# Patient Record
Sex: Male | Born: 1980
Health system: Southern US, Community
[De-identification: ages and names within clinical notes are randomized; demographics above are authoritative.]

## PROBLEM LIST (undated history)

## (undated) DIAGNOSIS — I10 Essential (primary) hypertension: Secondary | ICD-10-CM

---

## 2005-11-02 ENCOUNTER — Emergency Department: Payer: Self-pay | Admitting: Internal Medicine

## 2006-03-14 ENCOUNTER — Other Ambulatory Visit: Payer: Self-pay

## 2006-03-14 ENCOUNTER — Emergency Department: Payer: Self-pay | Admitting: Emergency Medicine

## 2007-03-16 ENCOUNTER — Emergency Department: Payer: Self-pay | Admitting: Emergency Medicine

## 2007-04-07 ENCOUNTER — Emergency Department: Payer: Self-pay | Admitting: Emergency Medicine

## 2008-04-26 ENCOUNTER — Emergency Department: Payer: Self-pay | Admitting: Emergency Medicine

## 2008-10-18 ENCOUNTER — Emergency Department: Payer: Self-pay | Admitting: Emergency Medicine

## 2009-08-09 IMAGING — CT CT HEAD WITHOUT CONTRAST
2 series · 16 of 30 positions shown, 20 images · non-contrast
Comparison: none

REASON FOR EXAM: pain
COMMENTS:

[Series 2: without · axial · non-contrast · 0.43mm/px · z∈[-173,-48]mm · 13 of 39 slices shown, 17 images]
[im 3/39  brain]
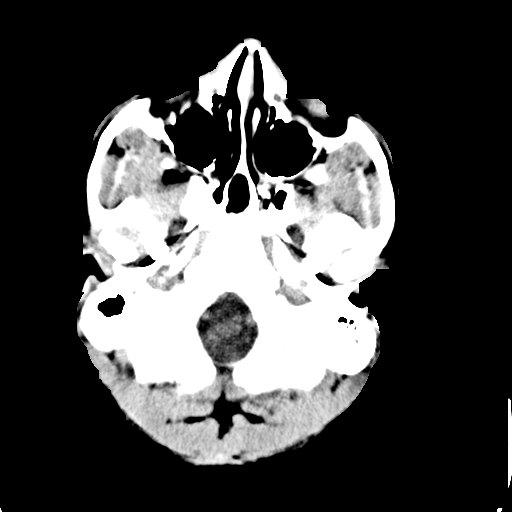
[im 3/39  bone]
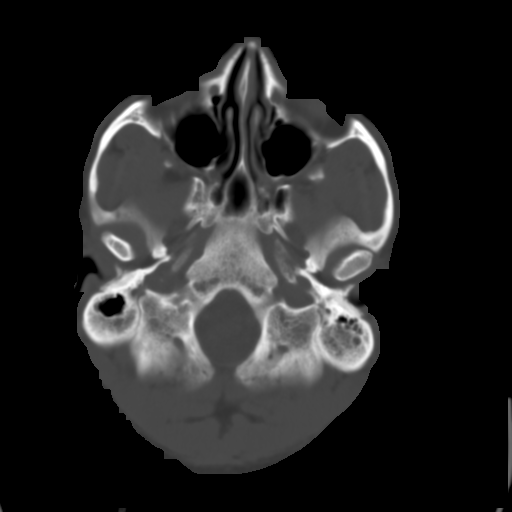
[im 6/39  brain]
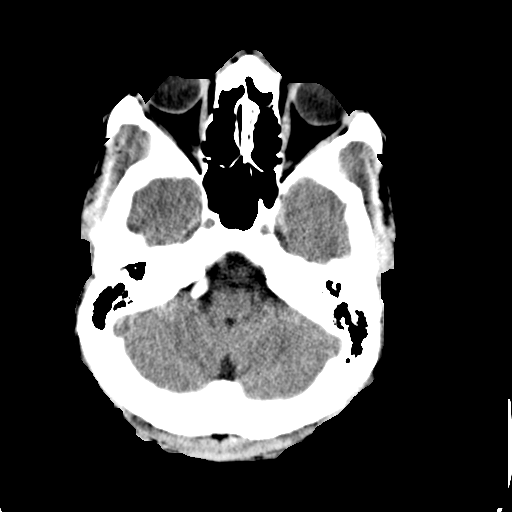
[im 9/39  brain]
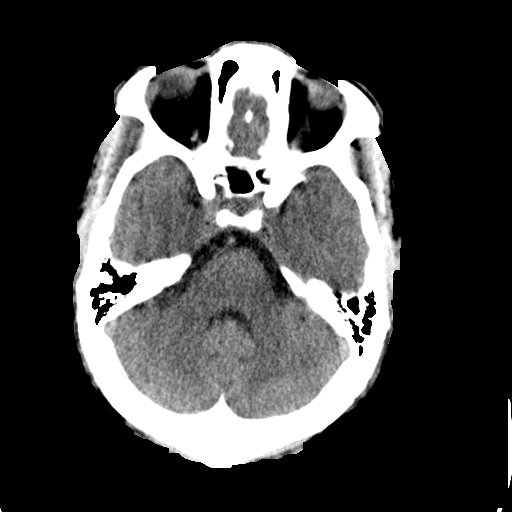
[im 11/39  brain]
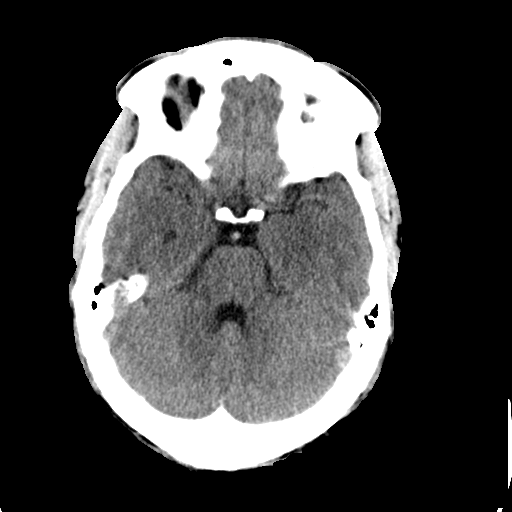
[im 14/39  brain]
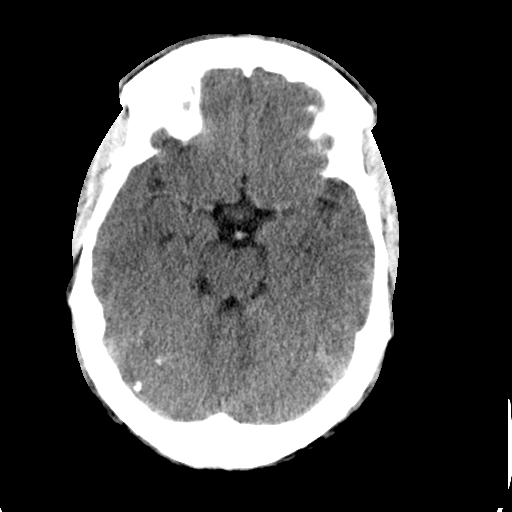
[im 14/39  bone]
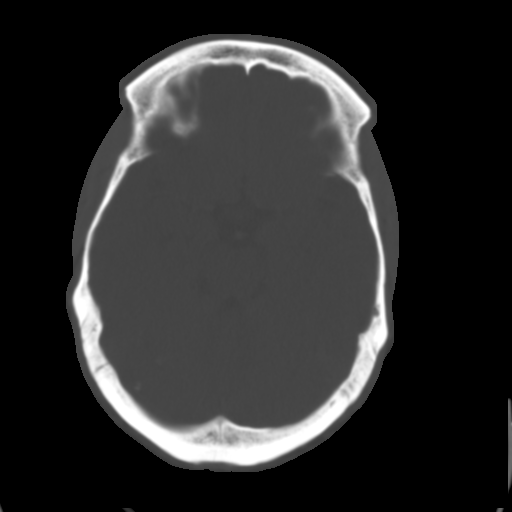
[im 17/39  brain]
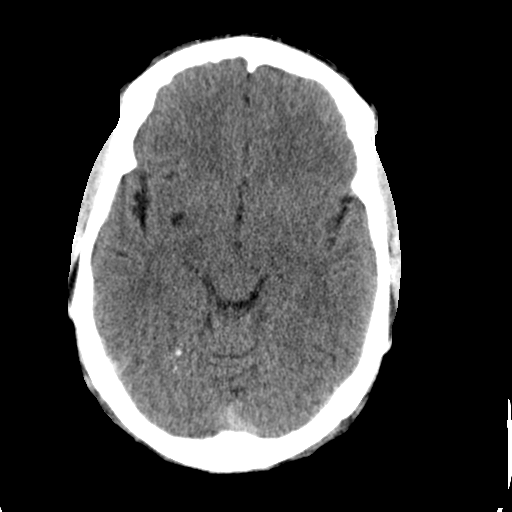
[im 20/39  brain]
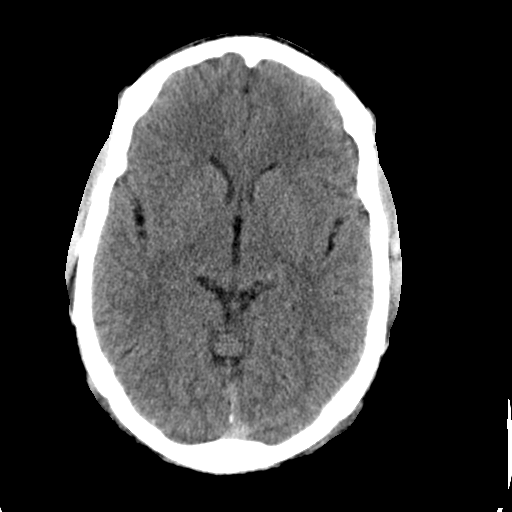
[im 22/39  brain]
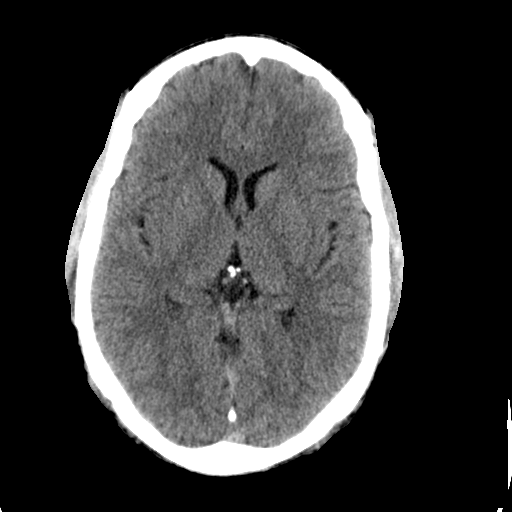
[im 25/39  brain]
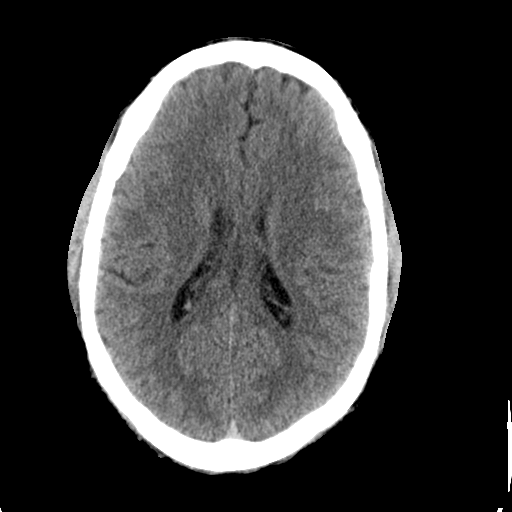
[im 25/39  bone]
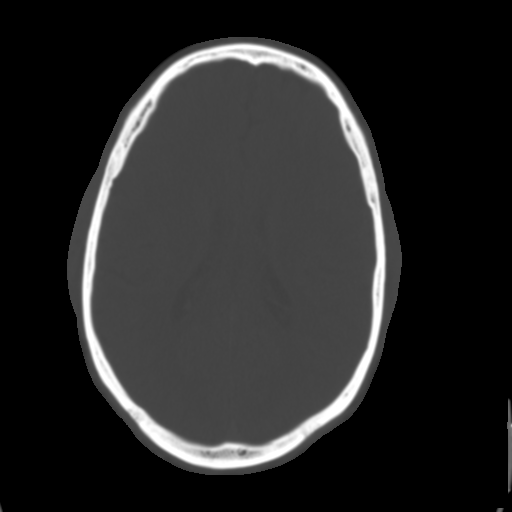
[im 28/39  brain]
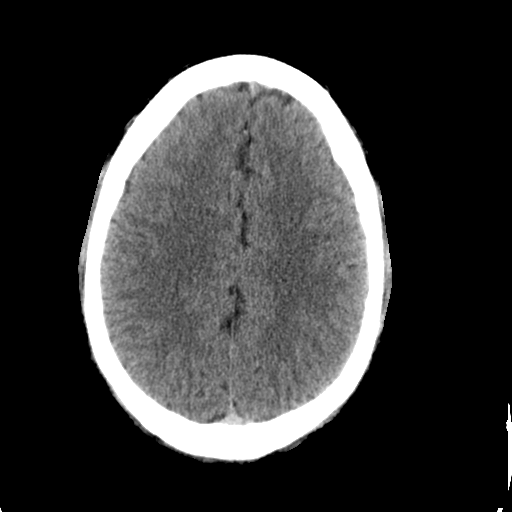
[im 30/39  brain]
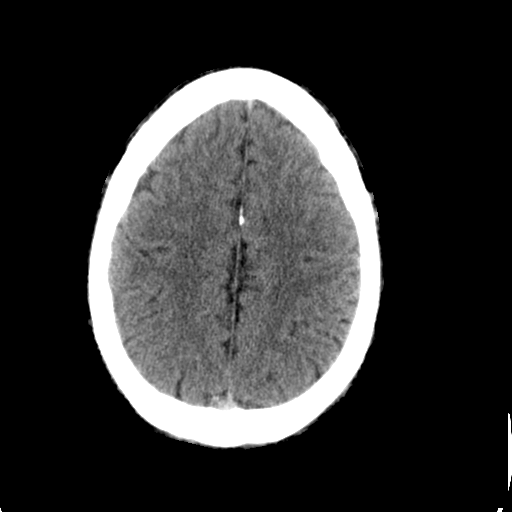
[im 33/39  brain]
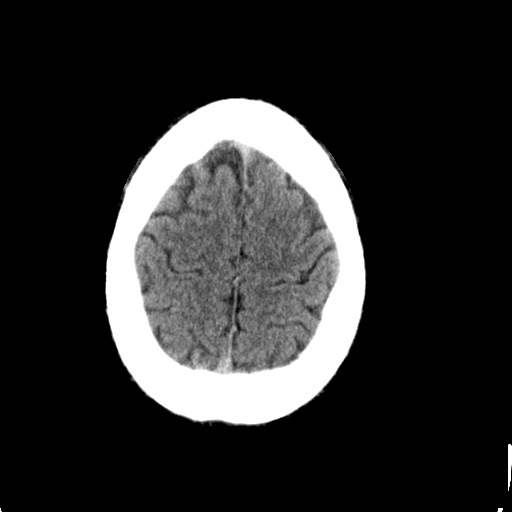
[im 36/39  brain]
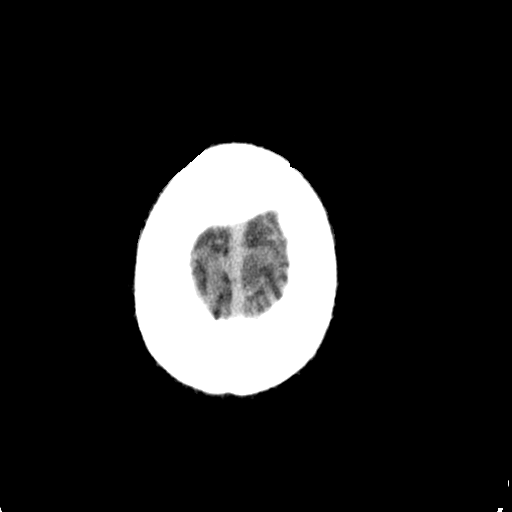
[im 36/39  bone]
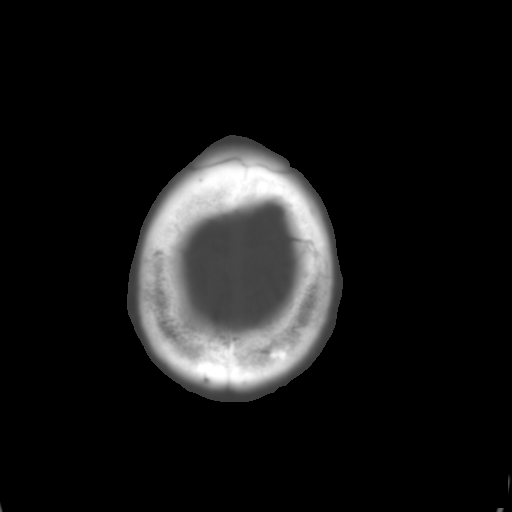

[Series 3: bone · axial · 0.43mm/px · z∈[-173,-138]mm · 3 of 39 slices shown]
[im 3/39  bone]
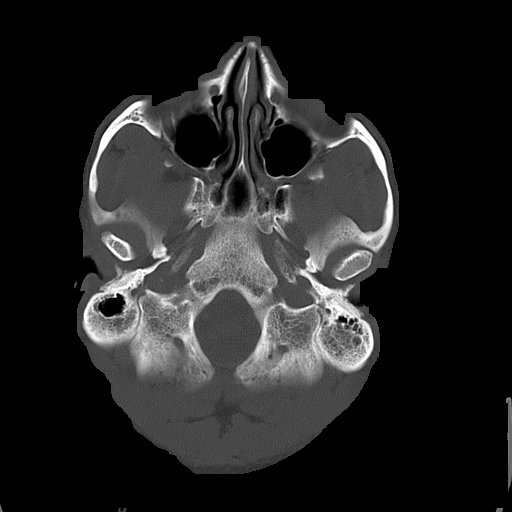
[im 9/39  bone]
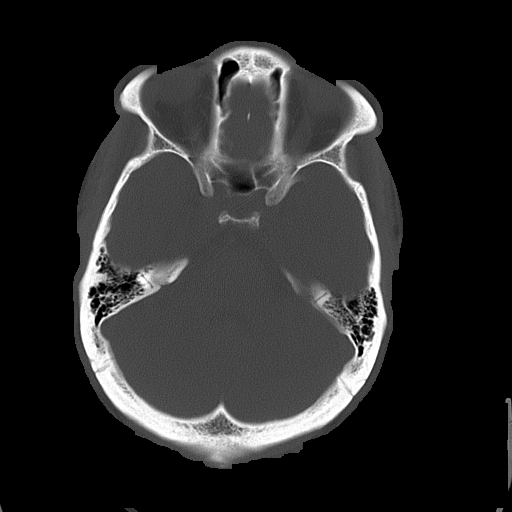
[im 14/39  bone]
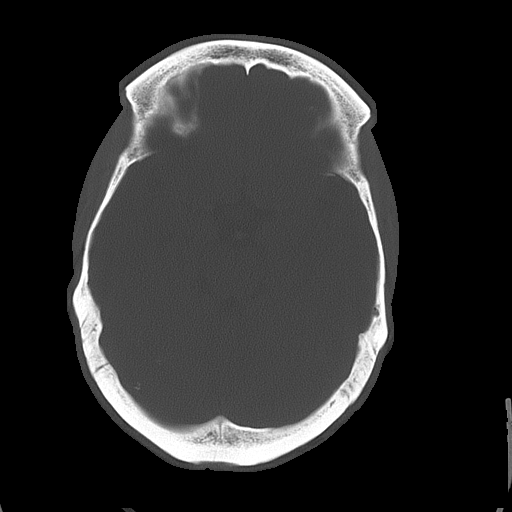

[16 of 30 positions shown; findings below may reference images not displayed]

PROCEDURE:     CT  - CT HEAD WITHOUT CONTRAST  - October 18, 2008  [DATE]

RESULT:     Axial noncontrast CT scanning was performed through the brain at
5 mm intervals and slice thicknesses.

The ventricles are normal in size and position. There is no evidence of an
intracranial hemorrhage nor of intracranial mass effect. The cerebellum and
brainstem are normal in density. At bone window settings the observed
portions of the paranasal sinuses are clear. I do not see evidence of an
acute skull fracture.
IMPRESSION: I see no acute intracranial abnormality.

A preliminary report was sent to the [HOSPITAL] the conclusion
of the study.

## 2009-11-25 ENCOUNTER — Emergency Department: Payer: Self-pay | Admitting: Emergency Medicine

## 2012-01-14 ENCOUNTER — Emergency Department: Payer: Self-pay | Admitting: *Deleted

## 2013-02-09 ENCOUNTER — Emergency Department: Payer: Self-pay | Admitting: Emergency Medicine

## 2013-02-09 LAB — COMPREHENSIVE METABOLIC PANEL
Albumin: 3.9 g/dL (ref 3.4–5.0)
Alkaline Phosphatase: 83 U/L (ref 50–136)
Anion Gap: 3 — ABNORMAL LOW (ref 7–16)
Bilirubin,Total: 1 mg/dL (ref 0.2–1.0)
Calcium, Total: 9.4 mg/dL (ref 8.5–10.1)
Co2: 31 mmol/L (ref 21–32)
Creatinine: 1.2 mg/dL (ref 0.60–1.30)
EGFR (Non-African Amer.): >60
Glucose: 95 mg/dL (ref 65–99)
Osmolality: 281 (ref 275–301)
Potassium: 4.1 mmol/L (ref 3.5–5.1)
SGPT (ALT): 25 U/L (ref 12–78)

## 2013-02-09 LAB — CBC
HGB: 17.3 g/dL (ref 13.0–18.0)
MCH: 31.1 pg (ref 26.0–34.0)
MCHC: 34.3 g/dL (ref 32.0–36.0)
RBC: 5.56 10*6/uL (ref 4.40–5.90)
RDW: 14.5 % (ref 11.5–14.5)

## 2013-02-09 LAB — DRUG SCREEN, URINE
Barbiturates, Ur Screen: NEGATIVE (ref ?–200)
Cannabinoid 50 Ng, Ur ~~LOC~~: NEGATIVE (ref ?–50)
Cocaine Metabolite,Ur ~~LOC~~: NEGATIVE (ref ?–300)
Methadone, Ur Screen: NEGATIVE (ref ?–300)
Opiate, Ur Screen: NEGATIVE (ref ?–300)
Phencyclidine (PCP) Ur S: NEGATIVE (ref ?–25)

## 2013-02-09 LAB — TROPONIN I
Troponin-I: <0.02 ng/mL
Troponin-I: <0.02 ng/mL

## 2013-02-09 LAB — CK TOTAL AND CKMB (NOT AT ARMC): CK-MB: 4.8 ng/mL — ABNORMAL HIGH (ref 0.5–3.6)

## 2013-12-01 IMAGING — CR DG CHEST 2V
1 series · 2 of 2 positions shown · non-contrast
Comparison: none

REASON FOR EXAM: Chest Pain
COMMENTS:

PROCEDURE:     DXR - DXR CHEST PA (OR AP) AND LATERAL  - February 09, 2013  [DATE]
RESULT:     Comparison: None

[Series 1: pa · 0.17mm/px · 2 of 2 slices shown]
[im 1/2]
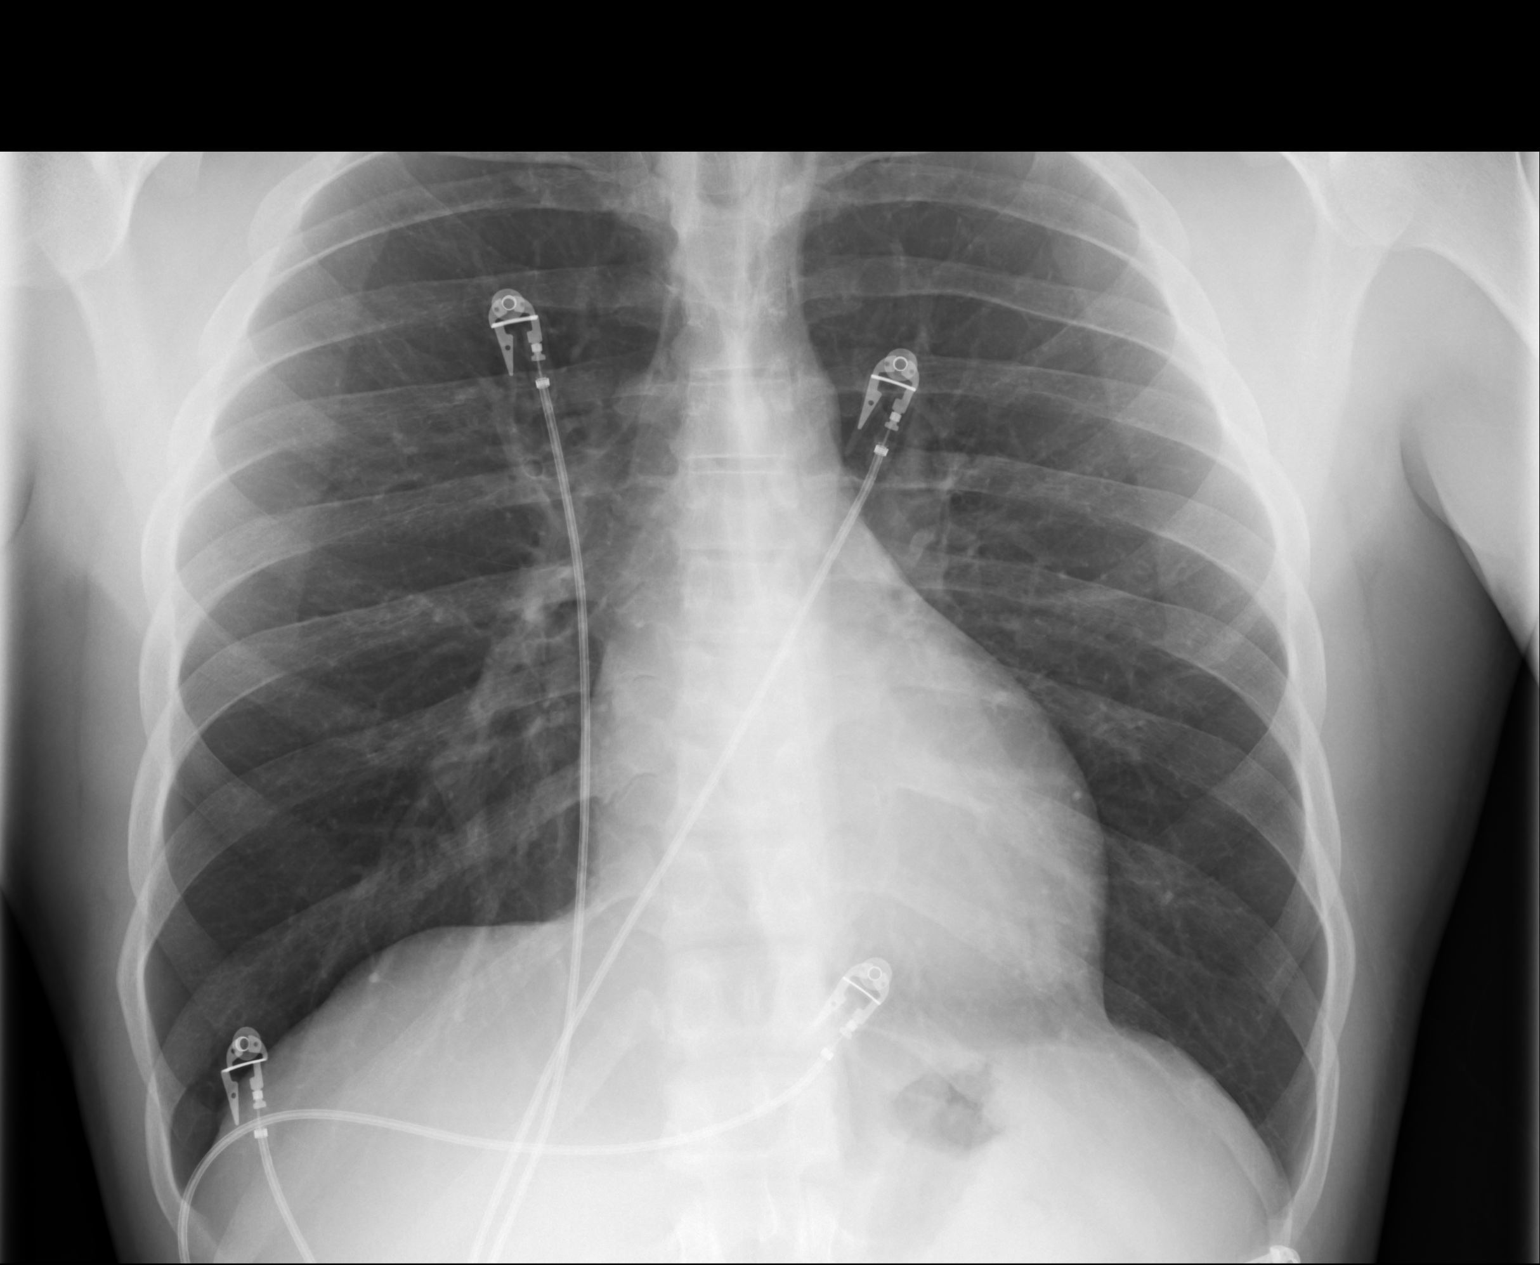
[im 2/2]
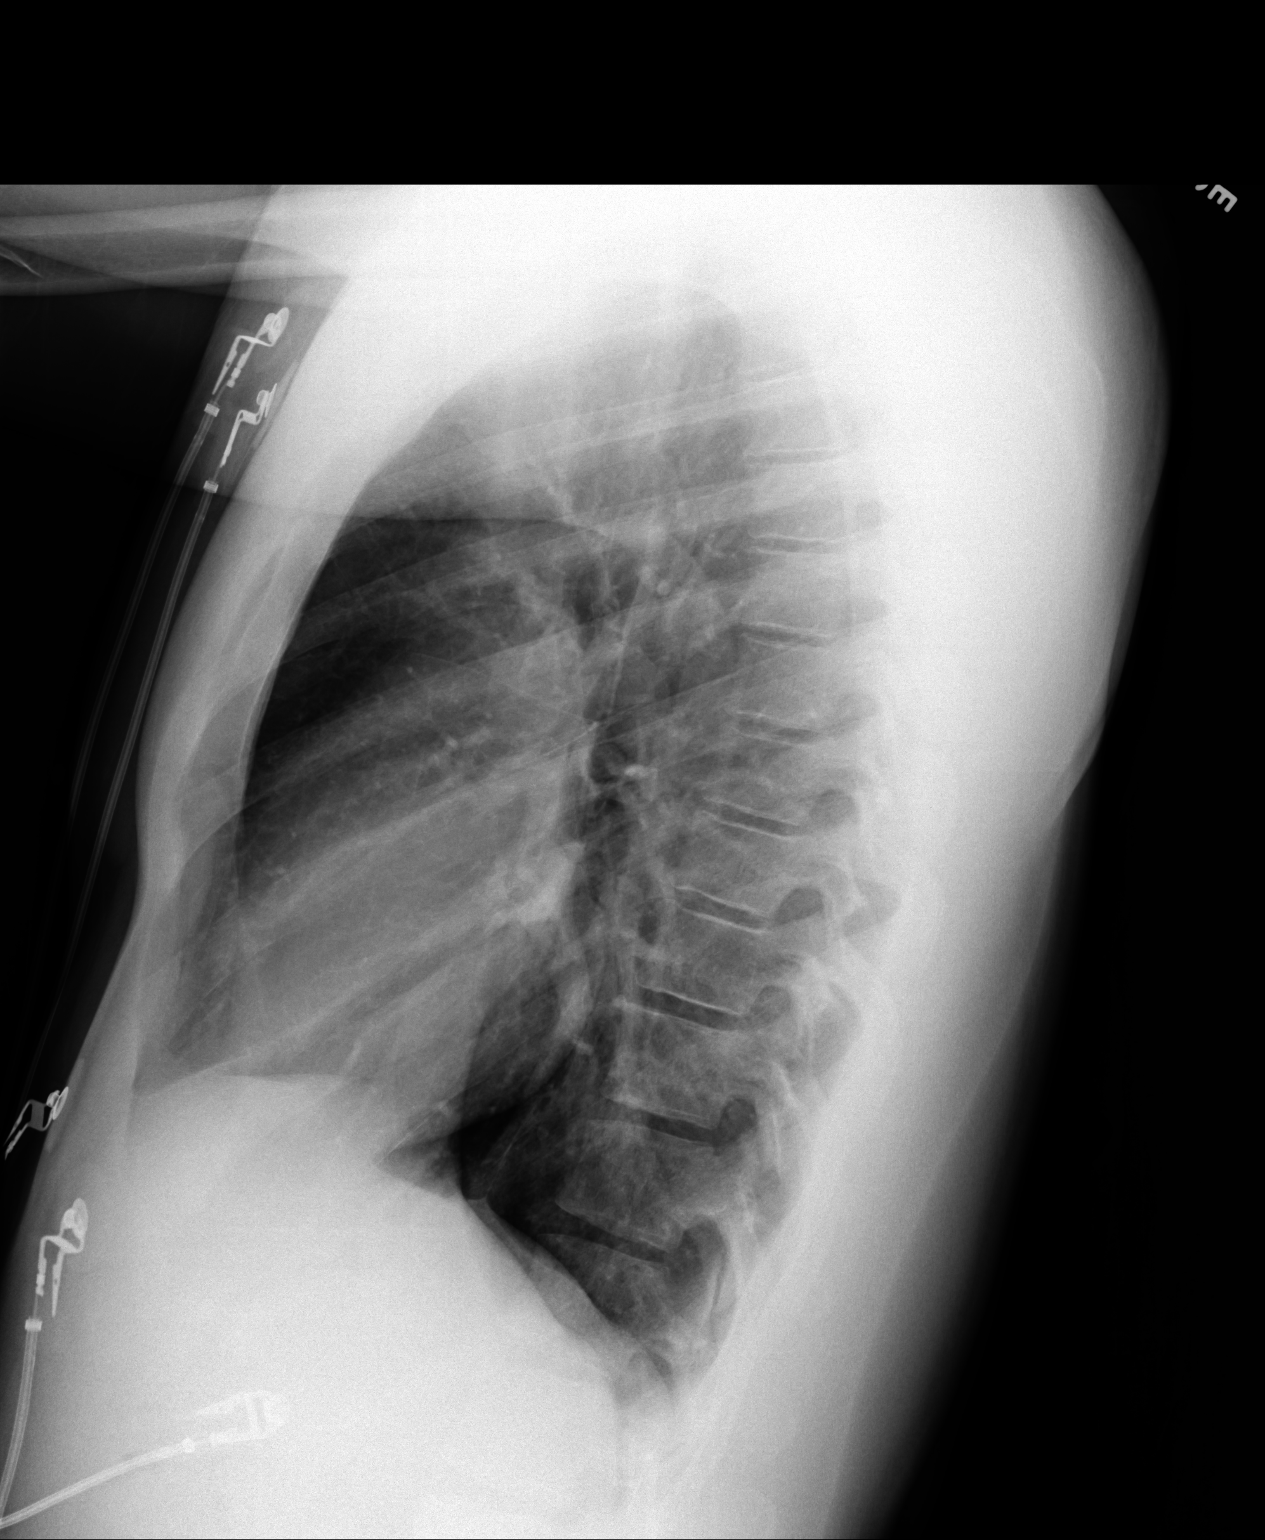

[2 of 2 positions shown; findings below may reference images not displayed]

FINDINGS: PA and lateral chest radiographs are provided.  There is no focal
parenchymal opacity, pleural effusion, or pneumothorax. The heart and
mediastinum are unremarkable.  The osseous structures are unremarkable.
IMPRESSION: No acute disease of the che[REDACTED]

## 2022-03-23 ENCOUNTER — Telehealth: Payer: Self-pay | Admitting: Family

## 2022-03-23 ENCOUNTER — Encounter: Payer: Self-pay | Admitting: Emergency Medicine

## 2022-03-23 ENCOUNTER — Ambulatory Visit
Admission: EM | Admit: 2022-03-23 | Discharge: 2022-03-23 | Disposition: A | Discharge status: Home / Self Care | Payer: Self-pay | Attending: Urgent Care | Admitting: Urgent Care

## 2022-03-23 DIAGNOSIS — R369 Urethral discharge, unspecified: Secondary | ICD-10-CM | POA: Insufficient documentation

## 2022-03-23 HISTORY — DX: Essential (primary) hypertension: I10

## 2022-03-23 MED ORDER — DOXYCYCLINE HYCLATE 100 MG PO CAPS: 100.0000 mg | ORAL_CAPSULE | Freq: Two times a day (BID) | ORAL | 0 refills | Status: AC

## 2022-03-23 MED ORDER — CEFTRIAXONE SODIUM 500 MG IJ SOLR
500.0000 mg | Freq: Once | INTRAMUSCULAR | Status: AC
  Administered 2022-03-23: 500 mg via INTRAMUSCULAR

## 2022-03-23 NOTE — ED Triage Notes (Signed)
Pt here with 5 days of penile discharge x 1 week.

## 2022-03-23 NOTE — Progress Notes (Signed)
Virtual Visit Consent   Gustavo Dispenza, you are scheduled for a virtual visit with a [facility] provider today. Just as with appointments in the office, your consent must be obtained to participate. Your consent will be active for this visit and any virtual visit you may have with one of our providers in the next 365 days. If you have a MyChart account, a copy of this consent can be sent to you electronically.  As this is a virtual visit, video technology does not allow for your provider to perform a traditional examination. This may limit your provider's ability to fully assess your condition. If your provider identifies any concerns that need to be evaluated in person or the need to arrange testing (such as labs, EKG, etc.), we will make arrangements to do so. Although advances in technology are sophisticated, we cannot ensure that it will always work on either your end or our end. If the connection with a video visit is poor, the visit may have to be switched to a telephone visit. With either a video or telephone visit, we are not always able to ensure that we have a secure connection.  By engaging in this virtual visit, you consent to the provision of healthcare and authorize for your insurance to be billed (if applicable) for the services provided during this visit. Depending on your insurance coverage, you may receive a charge related to this service.  I need to obtain your verbal consent now. Are you willing to proceed with your visit today? Troy Hoover has provided verbal consent on 03/23/2022 for a virtual visit (video or telephone). Jannifer Rodney, FNP  Date: 03/23/2022 10:20 AM  Virtual Visit via Video Note   I, Jannifer Rodney, connected with  Troy Hoover  ([id number], [birth year]) on 03/23/22 at 10:15 AM EDT by a video-enabled telemedicine application and verified that I am speaking with the correct person using two identifiers.  Location: Patient: Virtual Visit Location Patient:  Home Provider: Virtual Visit Location Provider: Home Office   I discussed the limitations of evaluation and management by telemedicine and the availability of in person appointments. The patient expressed understanding and agreed to proceed.    History of Present Illness: Troy Hoover is a 41 y.o. who identifies as a male who was assigned male at birth, and is being seen today for penile discharge. Last week had unprotected sex.   HPI: Penile Discharge The patient's primary symptoms include penile discharge. The patient's pertinent negatives include no genital injury, genital itching, genital lesions, scrotal swelling or testicular pain. The current episode started in the past 7 days. The pain is mild. Associated symptoms include frequency. Pertinent negatives include no chills, coughing, diarrhea, fever, flank pain, hematuria, joint pain, nausea, shortness of breath or sore throat. The penile discharge was Clear and yellow.    Problems: There are no problems to display for this patient.   Allergies: Not on File Medications: No current outpatient medications on file.  Observations/Objective: Patient is well-developed, well-nourished in no acute distress.  Resting comfortably  at home.  Head is normocephalic, atraumatic.  No labored breathing.  Speech is clear and coherent with logical content.  Patient is alert and oriented at baseline.    Assessment and Plan: 1. Penile discharge  Worrisome for Gonorrhea Will send to Urgent Care to get tested Safe sex, but should abstain until treatment is complete    Follow Up Instructions: I discussed the assessment and treatment plan with the patient. The patient was provided an  opportunity to ask questions and all were answered. The patient agreed with the plan and demonstrated an understanding of the instructions.  A copy of instructions were sent to the patient via MyChart unless otherwise noted below.     The patient was advised to call  back or seek an in-person evaluation if the symptoms worsen or if the condition fails to improve as anticipated.  Time:  I spent 11 minutes with the patient via telehealth technology discussing the above problems/concerns.    Evelina Dun, FNP

## 2022-03-23 NOTE — ED Provider Notes (Signed)
  [facility]  Note:  This document was prepared using Systems analyst and may include unintentional dictation errors.  MRN: [id number] DOB: [birth year]  Subjective:   Troy Hoover is a 41 y.o. male presenting for 1 week history of persistent penile discharge.  Patient is sexually active.  Would like to get treated for STIs and is okay with testing.  No current facility-administered medications for this encounter. No current outpatient medications on file.   No Known Allergies  Past Medical History:  Diagnosis Date   Hypertension      No psh.   No family history on file.  Social History   Tobacco Use   Smoking status: Never   Smokeless tobacco: Never  Substance Use Topics   Alcohol use: Never    ROS   Objective:   Vitals: BP (!) 167/125   Pulse 83   Temp 97.9 F (36.6 C)   Resp 18   SpO2 98%   Physical Exam Constitutional:      General: He is not in acute distress.    Appearance: Normal appearance. He is well-developed and normal weight. He is not ill-appearing, toxic-appearing or diaphoretic.  HENT:     Head: Normocephalic and atraumatic.     Right Ear: External ear normal.     Left Ear: External ear normal.     Nose: Nose normal.     Mouth/Throat:     Pharynx: Oropharynx is clear.  Eyes:     General: No scleral icterus.       Right eye: No discharge.        Left eye: No discharge.     Extraocular Movements: Extraocular movements intact.  Cardiovascular:     Rate and Rhythm: Normal rate.  Pulmonary:     Effort: Pulmonary effort is normal.  Genitourinary:    Penis: Circumcised. Discharge present. No phimosis, paraphimosis, hypospadias, erythema, tenderness, swelling or lesions.   Musculoskeletal:     Cervical back: Normal range of motion.  Neurological:     Mental Status: He is alert and oriented to person, place, and time.  Psychiatric:        Mood and Affect: Mood normal.        Behavior: Behavior  normal.        Thought Content: Thought content normal.        Judgment: Judgment normal.    IM ceftriaxone in clinic at 500 mg.  Assessment and Plan :   PDMP not reviewed this encounter.  1. Penile discharge    Patient treated empirically as per CDC guidelines with IM ceftriaxone, doxycycline as an outpatient.  Labs pending.   Counseled on safe sex practices including abstaining for 1 week following treatment.  Counseled patient on potential for adverse effects with medications prescribed/recommended today, ER and return-to-clinic precautions discussed, patient verbalized understanding.    Jaynee Eagles, PA-C 03/23/22 1132

## 2022-03-23 NOTE — Discharge Instructions (Addendum)
Avoid all forms of sexual intercourse (oral, vaginal, anal) for the next 7 days to avoid spreading/reinfecting or at least until we can see what kinds of infection results are positive.  Abstaining for 2 weeks would be better but at least 1 week is required.  We will let you know about your test results from the swab we did today and if you need any prescriptions for antibiotics or changes to your treatment from today.  

## 2022-03-26 LAB — CYTOLOGY, (ORAL, ANAL, URETHRAL) ANCILLARY ONLY
Chlamydia: NEGATIVE
Comment: NEGATIVE
Comment: NEGATIVE
Comment: NORMAL
Neisseria Gonorrhea: POSITIVE — AB
Trichomonas: NEGATIVE

## 2022-06-09 ENCOUNTER — Encounter: Payer: Self-pay | Admitting: Emergency Medicine

## 2023-07-29 ENCOUNTER — Other Ambulatory Visit: Payer: Self-pay | Admitting: Critical Care Medicine

## 2023-07-29 ENCOUNTER — Other Ambulatory Visit (HOSPITAL_COMMUNITY): Payer: Self-pay

## 2023-07-29 ENCOUNTER — Encounter: Payer: Self-pay | Admitting: Critical Care Medicine

## 2023-07-29 MED ORDER — AMLODIPINE BESYLATE 5 MG PO TABS
5.0000 mg | ORAL_TABLET | Freq: Every day | ORAL | 0 refills | Status: DC
  Filled 2023-07-29: qty 60, 60d supply, fill #0

## 2023-07-29 NOTE — Progress Notes (Signed)
HTN

## 2023-07-30 ENCOUNTER — Encounter: Payer: Self-pay | Admitting: *Deleted

## 2023-07-30 NOTE — Progress Notes (Signed)
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

## 2023-07-30 NOTE — Congregational Nurse Program (Signed)
  Dept: [phone]   Congregational Nurse Program Note  Date of Encounter: 07/30/2023  Past Medical History: Past Medical History:  Diagnosis Date   Hypertension     Encounter Details:  Community Questionnaire - 07/30/23 1602       Questionnaire   Ask client: Do you give verbal consent for me to treat you today? N/A    Student Assistance N/A    Location Patient Served  [facility]    Encounter Setting CN site    Population Status Unhoused    Insurance Unknown    Insurance/Financial Assistance Referral N/A    Medication Have Medication Insecurities;Provided Medication Assistance    Medical Provider No    Screening Referrals Made N/A    Medical Referrals Made N/A    Medical Appointment Completed [facility]    CNP Interventions Advocate/Support;Case Management    Screenings CN Performed N/A    ED Visit Averted N/A    Life-Saving Intervention Made N/A           Medication picked up from [facility] and brought to [facility] per MD request. Client could not be located. Medication left at front desk with staff to give to client. Zanyah Lentsch W RN CN

## 2023-08-20 ENCOUNTER — Other Ambulatory Visit: Payer: Self-pay

## 2023-08-20 ENCOUNTER — Encounter: Payer: Self-pay | Admitting: Internal Medicine

## 2023-08-20 ENCOUNTER — Other Ambulatory Visit: Payer: Self-pay | Admitting: Internal Medicine

## 2023-08-20 ENCOUNTER — Encounter: Payer: Self-pay | Admitting: *Deleted

## 2023-08-20 MED ORDER — GUAIFENESIN-DM 100-10 MG/5ML PO SYRP
5.0000 mL | ORAL_SOLUTION | ORAL | 0 refills | Status: AC | PRN
  Filled 2023-08-20: qty 118, 4d supply, fill #0

## 2023-08-20 MED ORDER — AZITHROMYCIN 500 MG PO TABS
500.0000 mg | ORAL_TABLET | Freq: Every day | ORAL | 0 refills | Status: AC
  Filled 2023-08-20: qty 5, 5d supply, fill #0

## 2023-08-20 NOTE — Progress Notes (Unsigned)
43 year old with PMH HTN, presents with worsening cough for 3 weeks, productive, sinus congestions, denies fever, weight loss, he has not not been taking medications for BP>

## 2023-08-20 NOTE — Congregational Nurse Program (Signed)
  Dept: [phone]   Congregational Nurse Program Note  Date of Encounter: 08/20/2023  Past Medical History: Past Medical History:  Diagnosis Date   Hypertension     Encounter Details:  Community Questionnaire - 08/20/23 1506       Questionnaire   Ask client: Do you give verbal consent for me to treat you today? Yes    Student Assistance N/A    Location Patient Served  [facility]    Encounter Setting CN site    Population Status Unhoused    Insurance Unknown    Insurance/Financial Assistance Referral N/A    Medication Have Medication Insecurities;Provided Medication Assistance    Medical Provider No    Screening Referrals Made N/A    Medical Referrals Made [facility]    Medical Appointment Completed [facility]    CNP Interventions Advocate/Support;Navigate Healthcare System;Case Management    Screenings CN Performed N/A    ED Visit Averted N/A    Life-Saving Intervention Made N/A           Picked up azithromycin from [facility] pharmacy and brought to [facility] for client. Made an appt with Primary Care at [facility] [address] [facility] with Dr Andrey Campanile April 28th 10:40. Could not locate client at [facility]. Left medication and appt information in a bag at [facility] front desk with client's name and bed number. Audreana Hancox W RN CN

## 2023-08-20 NOTE — Progress Notes (Signed)
43 year old with PMH  HTN presents presents complaining worsening cough, specially at night for last 3 weeks. He denies weight loss, fever. It started with runny nose, congestions. He denies history of smoking.   Lungs exam, no wheezing, BL ronchus. Normal respiratory effort.   Plan:  -He will be treated for Bronchitis with Azithromycin for 5 days.  -He was advised to look for medical attention if cough does not improved after completion of antibiotics.  -he was provided with Cetirizine and Robitussin.   HTN; BP elevated today. He has not started taking Norvasc prescribe last visit. Counseling provided in regards importance of BP controlled. He was going to take meds after appointment. He was advised to return next week for BP follow up. No neuro symptoms.

## 2023-11-02 ENCOUNTER — Ambulatory Visit: Payer: Self-pay | Admitting: Family Medicine

## 2024-05-10 ENCOUNTER — Telehealth: Payer: Self-pay | Admitting: Nurse Practitioner

## 2024-05-10 ENCOUNTER — Encounter: Payer: Self-pay | Admitting: Nurse Practitioner

## 2024-05-10 ENCOUNTER — Other Ambulatory Visit: Payer: Self-pay

## 2024-05-10 DIAGNOSIS — I10 Essential (primary) hypertension: Secondary | ICD-10-CM

## 2024-05-10 MED ORDER — AMLODIPINE BESYLATE 5 MG PO TABS
5.0000 mg | ORAL_TABLET | Freq: Every day | ORAL | 0 refills | Status: AC
  Filled 2024-05-10: qty 30, 30d supply, fill #0

## 2024-05-10 NOTE — Progress Notes (Signed)
 Acute Video Visit    Virtual Visit Consent:   Konstantin L Pilling, you are scheduled for a virtual visit with a [facility] provider today.     Just as with appointments in the office, your consent must be obtained to participate.  Your consent will be active for this visit and any virtual visit you may have with one of our providers in the next 365 days.     If you have a MyChart account, a copy of this consent can be sent to you electronically.  All virtual visits are billed to your insurance company just like a traditional visit in the office.    If the connection with a video visit is poor, the visit may have to be switched to a telephone visit.  With either a video or telephone visit, we are not always able to ensure that we have a secure connection.     I need to obtain your verbal consent now.   Are you willing to proceed with your visit today?    Kion L Stfort has provided verbal consent on 05/10/2024 for a virtual visit (video or telephone).   Lauraine Kitty, FNP  Date: 05/10/2024 11:59 AM  Subjective:     Patient ID: Troy LITTIE Easterly, male    DOB: 05/01/1981, 43 y.o.   MRN: [id number]  LILLETTE Lauraine Kitty, connected with  YOLTZIN BARG  ([id number], [birth year]) on 05/10/24 at 12:20 PM EST by a video-enabled telemedicine application and verified that I am speaking with the correct person using two identifiers.   Location: Patient: [facility]  Provider: Virtual Visit Location Provider: Home Office   I discussed the limitations of evaluation and management by telemedicine and the availability of in person appointments. The patient expressed understanding and agreed to proceed.      HPI BOWDY BAIR is a 43 y.o. who identifies as a male who was assigned male at birth, and is being seen today for request of medication refill   The patient is seeking care today at the [facility]  He is under the care of Ronal Jenkins Houseman and will be established with [facility] next month. He has  also seen the Congregational nurse in the past  His blood pressure has been well maintained on amlodipine  without SE  Denies any other acute needs today       Objective:    Physical Exam Constitutional:      General: He is not in acute distress.    Appearance: Normal appearance. He is not ill-appearing.  HENT:     Nose: Nose normal.     Mouth/Throat:     Mouth: Mucous membranes are moist.  Pulmonary:     Effort: Pulmonary effort is normal.  Neurological:     Mental Status: He is alert and oriented to person, place, and time.  Psychiatric:        Mood and Affect: Mood normal.         Assessment & Plan:    Encouraged to use VPC while awaiting new patient appointment next month for any acute needs   1. Primary hypertension (Primary)  - amLODipine  (NORVASC ) 5 MG tablet; Take 1 tablet (5 mg total) by mouth daily.  Dispense: 30 tablet; Refill: 0   Follow Up Instructions: I discussed the assessment and treatment plan with the patient. The patient was provided an opportunity to ask questions and all were answered. The patient agreed with the plan and demonstrated an understanding of  the instructions.  A copy of instructions were sent to the patient via MyChart unless otherwise noted below.    The patient was advised to call back or seek an in-person evaluation if the symptoms worsen or if the condition fails to improve as anticipated.    Lauraine Kitty, FNP  **Disclaimer: This note may have been dictated with voice recognition software. Similar sounding words can inadvertently be transcribed and this note may contain transcription errors which may not have been corrected upon publication of note.**

## 2024-05-18 ENCOUNTER — Other Ambulatory Visit: Payer: Self-pay

## 2024-05-19 ENCOUNTER — Other Ambulatory Visit: Payer: Self-pay

## 2024-07-21 NOTE — Progress Notes (Signed)
 The patient presented for a primary care visit on 05/10/24 to establish care. Blood pressure screening wasn't conducted. During the appointment, the patient did not report any (SDOH).  A review of the patient's chart revealed that they do not currently have a primary care provider (PCP) and no future appointments were indicated. CHW called pt to see if he was interested in having [facility]ah as his pcp, person stated that was the wrong number. Chw emailed pt pcp resources. An additional follow up will be done according to the health equity team's protocol.
